# Patient Record
Sex: Female | Born: 1990 | Race: White | Hispanic: No | Marital: Married | Smoking: Current every day smoker
Health system: Southern US, Community
[De-identification: ages and names within clinical notes are randomized; demographics above are authoritative.]

## PROBLEM LIST (undated history)

## (undated) DIAGNOSIS — E7800 Pure hypercholesterolemia, unspecified: Secondary | ICD-10-CM

## (undated) DIAGNOSIS — N289 Disorder of kidney and ureter, unspecified: Secondary | ICD-10-CM

## (undated) HISTORY — PX: WISDOM TOOTH EXTRACTION: SHX21

## (undated) HISTORY — PX: CHOLECYSTECTOMY: SHX55

---

## 2013-05-06 ENCOUNTER — Emergency Department (HOSPITAL_COMMUNITY): Payer: Medicaid Other

## 2013-05-06 ENCOUNTER — Emergency Department (HOSPITAL_COMMUNITY)
Admission: EM | Admit: 2013-05-06 | Discharge: 2013-05-07 | Disposition: A | Discharge status: Home / Self Care | Payer: Medicaid Other | Attending: Emergency Medicine | Admitting: Emergency Medicine

## 2013-05-06 ENCOUNTER — Encounter (HOSPITAL_COMMUNITY): Payer: Self-pay | Admitting: *Deleted

## 2013-05-06 DIAGNOSIS — R110 Nausea: Secondary | ICD-10-CM | POA: Insufficient documentation

## 2013-05-06 DIAGNOSIS — R102 Pelvic and perineal pain: Secondary | ICD-10-CM

## 2013-05-06 DIAGNOSIS — F17200 Nicotine dependence, unspecified, uncomplicated: Secondary | ICD-10-CM | POA: Insufficient documentation

## 2013-05-06 DIAGNOSIS — R109 Unspecified abdominal pain: Secondary | ICD-10-CM

## 2013-05-06 DIAGNOSIS — N949 Unspecified condition associated with female genital organs and menstrual cycle: Secondary | ICD-10-CM | POA: Insufficient documentation

## 2013-05-06 DIAGNOSIS — Z79899 Other long term (current) drug therapy: Secondary | ICD-10-CM | POA: Insufficient documentation

## 2013-05-06 DIAGNOSIS — Z3202 Encounter for pregnancy test, result negative: Secondary | ICD-10-CM | POA: Insufficient documentation

## 2013-05-06 LAB — COMPREHENSIVE METABOLIC PANEL
AST: 23 U/L (ref 0–37)
CO2: 28 mEq/L (ref 19–32)
Calcium: 9.9 mg/dL (ref 8.4–10.5)
Creatinine, Ser: 0.74 mg/dL (ref 0.50–1.10)
GFR calc Af Amer: >90 mL/min (ref >90)
GFR calc non Af Amer: >90 mL/min (ref >90)
Total Protein: 7.9 g/dL (ref 6.0–8.3)

## 2013-05-06 LAB — URINALYSIS, ROUTINE W REFLEX MICROSCOPIC
Nitrite: POSITIVE — AB
Specific Gravity, Urine: 1.015 (ref 1.005–1.030)
Urobilinogen, UA: 1 mg/dL (ref 0.0–1.0)
pH: 6 (ref 5.0–8.0)

## 2013-05-06 LAB — URINE MICROSCOPIC-ADD ON

## 2013-05-06 LAB — CBC WITH DIFFERENTIAL/PLATELET
Basophils Absolute: 0 10*3/uL (ref 0.0–0.1)
Eosinophils Absolute: 0.3 10*3/uL (ref 0.0–0.7)
Eosinophils Relative: 3 % (ref 0–5)
HCT: 43.5 % (ref 36.0–46.0)
Lymphocytes Relative: 27 % (ref 12–46)
MCH: 30.3 pg (ref 26.0–34.0)
MCHC: 34.7 g/dL (ref 30.0–36.0)
MCV: 87.3 fL (ref 78.0–100.0)
Monocytes Absolute: 0.6 10*3/uL (ref 0.1–1.0)
Platelets: 309 10*3/uL (ref 150–400)
RDW: 13.2 % (ref 11.5–15.5)
WBC: 11.4 10*3/uL — ABNORMAL HIGH (ref 4.0–10.5)

## 2013-05-06 LAB — POCT PREGNANCY, URINE: Preg Test, Ur: NEGATIVE

## 2013-05-06 LAB — POCT I-STAT TROPONIN I: Troponin i, poc: 0.01 ng/mL (ref 0.00–0.08)

## 2013-05-06 MED ORDER — IOHEXOL 300 MG/ML  SOLN
100.0000 mL | Freq: Once | INTRAMUSCULAR | Status: AC | PRN
  Administered 2013-05-06: 100 mL via INTRAVENOUS

## 2013-05-06 MED ORDER — HYDROCODONE-ACETAMINOPHEN 5-325 MG PO TABS: 1.0000 | ORAL_TABLET | ORAL | Status: DC | PRN

## 2013-05-06 MED ORDER — ONDANSETRON 4 MG PO TBDP: 8.0000 mg | ORAL_TABLET | Freq: Once | ORAL | Status: DC

## 2013-05-06 MED ORDER — HYDROMORPHONE HCL PF 1 MG/ML IJ SOLN
1.0000 mg | Freq: Once | INTRAMUSCULAR | Status: AC
  Administered 2013-05-06: 1 mg via INTRAVENOUS

## 2013-05-06 MED ORDER — ONDANSETRON HCL 4 MG/2ML IJ SOLN
4.0000 mg | Freq: Once | INTRAMUSCULAR | Status: AC
  Administered 2013-05-06: 4 mg via INTRAVENOUS
  Filled 2013-05-06: qty 2

## 2013-05-06 MED ORDER — MORPHINE SULFATE 4 MG/ML IJ SOLN
4.0000 mg | Freq: Once | INTRAMUSCULAR | Status: AC
  Administered 2013-05-06: 4 mg via INTRAVENOUS
  Filled 2013-05-06: qty 1

## 2013-05-06 MED ORDER — HYDROMORPHONE HCL PF 1 MG/ML IJ SOLN
1.0000 mg | INTRAMUSCULAR | Status: DC | PRN
  Filled 2013-05-06: qty 1

## 2013-05-06 MED ORDER — ONDANSETRON 4 MG PO TBDP: ORAL_TABLET | ORAL | Status: DC

## 2013-05-06 MED ORDER — IOHEXOL 300 MG/ML  SOLN
20.0000 mL | INTRAMUSCULAR | Status: AC
  Administered 2013-05-06: 50 mL via ORAL

## 2013-05-06 MED ORDER — NAPROXEN 500 MG PO TABS: 500.0000 mg | ORAL_TABLET | Freq: Two times a day (BID) | ORAL | Status: DC

## 2013-05-06 NOTE — ED Notes (Signed)
Pt c/o pain.  No meds ordered

## 2013-05-06 NOTE — ED Notes (Signed)
Iv placed.  Xray tech brought oral  contrast

## 2013-05-06 NOTE — ED Provider Notes (Signed)
History     CSN: [id number]  Arrival date & time 05/06/13  1906   First MD Initiated Contact with Patient 05/06/13 2023      Chief Complaint  Patient presents with  . Abdominal Pain    (Consider location/radiation/quality/duration/timing/severity/associated sxs/prior treatment) Patient is a 22 y.o. female presenting with abdominal pain. The history is provided by the patient.  Abdominal Pain This is a new problem. The current episode started in the past 7 days. The problem occurs constantly. The problem has been gradually worsening. Associated symptoms include abdominal pain, anorexia and nausea. Pertinent negatives include no chest pain, chills, congestion, fever, numbness, vomiting or weakness. Exacerbated by: movement. She has tried nothing for the symptoms.    History reviewed. No pertinent past medical history.  Past Surgical History  Procedure Laterality Date  . Cholecystectomy    . Wisdom tooth extraction      No family history on file.  History  Substance Use Topics  . Smoking status: Current Every Day Smoker  . Smokeless tobacco: Not on file  . Alcohol Use: Yes    OB History   Grav Para Term Preterm Abortions TAB SAB Ect Mult Living                  Review of Systems  Constitutional: Negative for fever and chills.  HENT: Negative for congestion and rhinorrhea.   Respiratory: Negative for chest tightness and shortness of breath.   Cardiovascular: Negative for chest pain.  Gastrointestinal: Positive for nausea, abdominal pain and anorexia. Negative for vomiting, diarrhea, constipation and rectal pain.  Genitourinary: Negative for dysuria.  Neurological: Negative for dizziness, weakness and numbness.  All other systems reviewed and are negative.    Allergies  Sulfa antibiotics  Home Medications   Current Outpatient Rx  Name  Route  Sig  Dispense  Refill  . metFORMIN (GLUCOPHAGE) 500 MG tablet   Oral   Take 500 mg by mouth daily with breakfast.            BP 125/79  Pulse 90  Temp(Src) 97.6 F (36.4 C) (Oral)  Resp 18  SpO2 96%  LMP 01/21/2013  Physical Exam  Nursing note and vitals reviewed. Constitutional: She is oriented to person, place, and time. She appears well-developed and well-nourished. No distress.  HENT:  Head: Normocephalic and atraumatic.  Mouth/Throat: Oropharynx is clear and moist.  Eyes: EOM are normal. Pupils are equal, round, and reactive to light.  Neck: Normal range of motion. Neck supple.  Cardiovascular: Normal rate, regular rhythm and normal heart sounds.  Exam reveals no friction rub.   No murmur heard. Pulmonary/Chest: Effort normal and breath sounds normal. No respiratory distress. She has no wheezes. She has no rales.  Abdominal: Soft. There is tenderness (moderate TTP RLQ). There is no rebound and no guarding.  Genitourinary: Vagina normal. No vaginal discharge found.  No CMT. Mild left adnexal discomfort. Moderate right adnexal TTP. Do not appreciate fullness or mass.   Musculoskeletal: Normal range of motion. She exhibits no edema and no tenderness.  Lymphadenopathy:    She has no cervical adenopathy.  Neurological: She is alert and oriented to person, place, and time.  Skin: Skin is warm and dry. No rash noted.  Psychiatric: She has a normal mood and affect. Her behavior is normal.    ED Course  Procedures (including critical care time)  Labs Reviewed  CBC WITH DIFFERENTIAL - Abnormal; Notable for the following:    WBC 11.4 (*)  Hemoglobin 15.1 (*)    All other components within normal limits  COMPREHENSIVE METABOLIC PANEL - Abnormal; Notable for the following:    Glucose, Bld 123 (*)    All other components within normal limits  URINALYSIS, ROUTINE W REFLEX MICROSCOPIC - Abnormal; Notable for the following:    APPearance TURBID (*)    Hgb urine dipstick SMALL (*)    Nitrite POSITIVE (*)    Leukocytes, UA TRACE (*)    All other components within normal limits  URINE  MICROSCOPIC-ADD ON - Abnormal; Notable for the following:    Squamous Epithelial / LPF FEW (*)    Bacteria, UA MANY (*)    All other components within normal limits  URINE CULTURE  POCT PREGNANCY, URINE  POCT I-STAT TROPONIN I   Ct Abdomen Pelvis W Contrast  05/06/2013   *RADIOLOGY REPORT*  Clinical Data: Umbilical pain  CT ABDOMEN AND PELVIS WITH CONTRAST  Technique:  Multidetector CT imaging of the abdomen and pelvis was performed following the standard protocol during bolus administration of intravenous contrast.  Contrast: OMNIPAQUE IOHEXOL 300 MG/ML  SOLN  Comparison: None.  Findings: Linear dependent atelectasis or interstitial infiltrate posteriorly in the visualized lung bases.  Vascular clips in the gallbladder fossa.  Unremarkable liver, spleen, adrenal glands, kidneys, pancreas, abdominal aorta, portal vein.  Stomach, small bowel, and colon are nondilated.  Normal appendix.  Uterus and adnexal regions unremarkable.  Urinary bladder incompletely distended.  No ascites.  No free air.  No adenopathy localized. Lumbar spine intact.  IMPRESSION:  Negative.  Normal appendix.   Original Report Authenticated By: D. Andria Rhein, MD     1. Abdominal pain   2. Adnexal pain       MDM  10:30 PM 21yo female here with 5 days of progressively worsening RLQ AP. Pain started periumbilical and migrated to RLQ a few days ago. Nausea, anorexia but no fever or vomiting. localized TTP over RLQ. Mild leukocytosis noted. Will obtain CT abd/pelvis.   11:30PM: CT negative for appendicitis. No nephrolithiasis noted. Pain improved. Patient ambulatory without difficulty. Tolerating by mouth well. Pelvic exam reveals moderate right adnexal tenderness. Given constant for a week do not feel this is ovarian torsion. Doubt TOA or PID. Discuss with patient. Given referral for women's health to followup and consider outpatient ultrasound. Do not feel she needs emergent ultrasound. She is understanding of plan.  Will discharge with a few Norco and Zofran. She's understanding of plan, given return precautions and discharged him stable condition.  Caren Hazy, MD 05/07/13 [phone]

## 2013-05-06 NOTE — ED Notes (Signed)
Stomach hurting since last Sunday.  Nauseas, dizziness, h/a; feels like she wants to fall.  Ambulatory in ED

## 2013-05-06 NOTE — ED Notes (Signed)
The pt is c/o rlq pain since Sunday with nausea.  lmp march.  She thinks she mat be pregnant but shes not sure alert oriented skin warm and dry

## 2013-05-06 NOTE — ED Provider Notes (Signed)
I saw and evaluated the patient, reviewed the resident's note and I agree with the findings and plan.  Several days right lower quadrant abdominal pain with mild to moderate localized tenderness without rebound.  Hurman Horn, MD 05/07/13 1254

## 2013-05-06 NOTE — ED Notes (Signed)
c-t notified that oral contrast was consumed

## 2013-05-09 LAB — URINE CULTURE

## 2013-05-10 NOTE — Progress Notes (Signed)
ED Antimicrobial Stewardship Positive Culture Follow Up   Wendy Mitchell is an 22 y.o. female who presented to [facility] on 05/06/2013 with a chief complaint of RLQ pain  Chief Complaint  Patient presents with  . Abdominal Pain    Recent Results (from the past 720 hour(s))  URINE CULTURE     Status: None   Collection Time    05/06/13  7:23 PM      Result Value Range Status   Specimen Description URINE, CLEAN CATCH   Final   Special Requests NONE   Final   Culture  Setup Time 05/07/2013 02:40   Final   Colony Count >=100,000 COLONIES/ML   Final   Culture ESCHERICHIA COLI   Final   Report Status 05/09/2013 FINAL   Final   Organism ID, Bacteria ESCHERICHIA COLI   Final    []  Treated with , organism resistant to prescribed antimicrobial [x]  Patient discharged originally without antimicrobial agent and treatment is now indicated  New antibiotic prescription: Cipro 500 mg twice daily for 7 days  ED Provider: Antony Madura, PA-C  Rolley Sims 05/10/2013, 10:08 AM Infectious Diseases Pharmacist Phone# [phone]

## 2013-05-10 NOTE — ED Notes (Signed)
Post ED Visit - Positive Culture Follow-up: Successful Patient Follow-Up  Culture assessed and recommendations reviewed by: []  Wes Dulaney, Pharm.D., BCPS []  Celedonio Miyamoto, Pharm.D., BCPS [x]  Georgina Pillion, Pharm.D., BCPS []  [place], [place].D., BCPS, AAHIVP []  Estella Husk, Pharm.D., BCPS, AAHIVP  Positive urine culture  [x]  Patient discharged without antimicrobial prescription and treatment is now indicated []  Organism is resistant to prescribed ED discharge antimicrobial []  Patient with positive blood cultures  Changes discussed with ED provider:Kelly Hemes New antibiotic prescription  Cipro-500 mg twice daily x 7 days Called to [id number] by Patty PFM Contacted patient : Patient informed of results 05/10/2013 @ 12:36    Larena Sox 05/10/2013, 12:43 PM

## 2013-05-19 ENCOUNTER — Encounter: Payer: Self-pay | Admitting: Advanced Practice Midwife

## 2013-08-07 ENCOUNTER — Encounter (HOSPITAL_COMMUNITY): Payer: Self-pay | Admitting: *Deleted

## 2013-08-07 ENCOUNTER — Emergency Department (HOSPITAL_COMMUNITY)
Admission: EM | Admit: 2013-08-07 | Discharge: 2013-08-07 | Disposition: A | Discharge status: Home / Self Care | Payer: Medicaid Other | Attending: Emergency Medicine | Admitting: Emergency Medicine

## 2013-08-07 DIAGNOSIS — Z9089 Acquired absence of other organs: Secondary | ICD-10-CM | POA: Insufficient documentation

## 2013-08-07 DIAGNOSIS — F17200 Nicotine dependence, unspecified, uncomplicated: Secondary | ICD-10-CM | POA: Insufficient documentation

## 2013-08-07 DIAGNOSIS — B9689 Other specified bacterial agents as the cause of diseases classified elsewhere: Secondary | ICD-10-CM

## 2013-08-07 DIAGNOSIS — N390 Urinary tract infection, site not specified: Secondary | ICD-10-CM

## 2013-08-07 DIAGNOSIS — Z87442 Personal history of urinary calculi: Secondary | ICD-10-CM | POA: Insufficient documentation

## 2013-08-07 DIAGNOSIS — Z3202 Encounter for pregnancy test, result negative: Secondary | ICD-10-CM | POA: Insufficient documentation

## 2013-08-07 DIAGNOSIS — N739 Female pelvic inflammatory disease, unspecified: Secondary | ICD-10-CM | POA: Insufficient documentation

## 2013-08-07 DIAGNOSIS — R42 Dizziness and giddiness: Secondary | ICD-10-CM | POA: Insufficient documentation

## 2013-08-07 DIAGNOSIS — R350 Frequency of micturition: Secondary | ICD-10-CM | POA: Insufficient documentation

## 2013-08-07 DIAGNOSIS — N898 Other specified noninflammatory disorders of vagina: Secondary | ICD-10-CM | POA: Insufficient documentation

## 2013-08-07 DIAGNOSIS — Z8619 Personal history of other infectious and parasitic diseases: Secondary | ICD-10-CM | POA: Insufficient documentation

## 2013-08-07 DIAGNOSIS — R110 Nausea: Secondary | ICD-10-CM | POA: Insufficient documentation

## 2013-08-07 HISTORY — DX: Disorder of kidney and ureter, unspecified: N28.9

## 2013-08-07 HISTORY — DX: Pure hypercholesterolemia, unspecified: E78.00

## 2013-08-07 LAB — COMPREHENSIVE METABOLIC PANEL
Albumin: 4.1 g/dL (ref 3.5–5.2)
Alkaline Phosphatase: 99 U/L (ref 39–117)
BUN: 11 mg/dL (ref 6–23)
Potassium: 4.2 mEq/L (ref 3.5–5.1)
Sodium: 141 mEq/L (ref 135–145)
Total Protein: 7.8 g/dL (ref 6.0–8.3)

## 2013-08-07 LAB — CBC WITH DIFFERENTIAL/PLATELET
Basophils Absolute: 0 10*3/uL (ref 0.0–0.1)
Basophils Relative: 0 % (ref 0–1)
Eosinophils Absolute: 0.4 10*3/uL (ref 0.0–0.7)
MCH: 30.3 pg (ref 26.0–34.0)
MCHC: 34.2 g/dL (ref 30.0–36.0)
Monocytes Relative: 6 % (ref 3–12)
Neutrophils Relative %: 61 % (ref 43–77)
Platelets: 310 10*3/uL (ref 150–400)
RDW: 12.7 % (ref 11.5–15.5)

## 2013-08-07 LAB — URINE MICROSCOPIC-ADD ON

## 2013-08-07 LAB — LIPASE, BLOOD: Lipase: 21 U/L (ref 11–59)

## 2013-08-07 LAB — URINALYSIS, ROUTINE W REFLEX MICROSCOPIC
Bilirubin Urine: NEGATIVE
Nitrite: POSITIVE — AB
Specific Gravity, Urine: 1.027 (ref 1.005–1.030)
pH: 6 (ref 5.0–8.0)

## 2013-08-07 LAB — POCT PREGNANCY, URINE: Preg Test, Ur: NEGATIVE

## 2013-08-07 LAB — WET PREP, GENITAL
Trich, Wet Prep: NONE SEEN
Yeast Wet Prep HPF POC: NONE SEEN

## 2013-08-07 MED ORDER — SODIUM CHLORIDE 0.9 % IV BOLUS (SEPSIS)
1000.0000 mL | Freq: Once | INTRAVENOUS | Status: AC
  Administered 2013-08-07: 1000 mL via INTRAVENOUS

## 2013-08-07 MED ORDER — METRONIDAZOLE 500 MG PO TABS: 500.0000 mg | ORAL_TABLET | Freq: Two times a day (BID) | ORAL | Status: AC

## 2013-08-07 MED ORDER — ONDANSETRON HCL 4 MG/2ML IJ SOLN
4.0000 mg | Freq: Once | INTRAMUSCULAR | Status: AC
  Administered 2013-08-07: 4 mg via INTRAVENOUS
  Filled 2013-08-07: qty 2

## 2013-08-07 MED ORDER — CEFTRIAXONE SODIUM 1 G IJ SOLR
1.0000 g | Freq: Once | INTRAMUSCULAR | Status: AC
  Administered 2013-08-07: 1 g via INTRAVENOUS
  Filled 2013-08-07: qty 10

## 2013-08-07 MED ORDER — HYDROMORPHONE HCL PF 1 MG/ML IJ SOLN
1.0000 mg | Freq: Once | INTRAMUSCULAR | Status: AC
  Administered 2013-08-07: 1 mg via INTRAVENOUS
  Filled 2013-08-07: qty 1

## 2013-08-07 MED ORDER — CEPHALEXIN 500 MG PO CAPS: 500.0000 mg | ORAL_CAPSULE | Freq: Four times a day (QID) | ORAL | Status: AC

## 2013-08-07 MED ORDER — DOXYCYCLINE HYCLATE 100 MG PO CAPS: 100.0000 mg | ORAL_CAPSULE | Freq: Two times a day (BID) | ORAL | Status: AC

## 2013-08-07 NOTE — ED Notes (Signed)
2nd meal tray given to pt with drink.

## 2013-08-07 NOTE — ED Provider Notes (Signed)
CSN: [id number]     Arrival date & time 08/07/13  1359 History   First MD Initiated Contact with Patient 08/07/13 1839     Chief Complaint  Patient presents with  . Abdominal Pain  . Dizziness   (Consider location/radiation/quality/duration/timing/severity/associated sxs/prior Treatment) The history is provided by the patient.   22 year old female with past surgical history of cholecystectomy here for abdominal pain and nausea. Patient reports the symptoms have been present for several days. The abdominal pain is located in her lower abdomen, rated at 7/10. It is described as aching in nature and is nonradiating.  She endorses some associated nausea, but has not had any vomiting.  She denies fevers, chills, cough, dysuria, diarrhea, constipation, anorexia, vaginal.  She is sexually active with one partner. She occasionally uses protection. She expressed some concern over STD exposure she has a history of gonorrhea and Chlamydia. She reports being tested for HIV in the past and that it was negative.  Past Medical History  Diagnosis Date  . Hypercholesteremia   . Renal disorder     kidney stones   Past Surgical History  Procedure Laterality Date  . Cholecystectomy    . Wisdom tooth extraction     No family history on file. History  Substance Use Topics  . Smoking status: Current Every Day Smoker -- 0.50 packs/day    Types: Cigarettes  . Smokeless tobacco: Not on file  . Alcohol Use: Yes   OB History   Grav Para Term Preterm Abortions TAB SAB Ect Mult Living                 Review of Systems  Constitutional: Negative for fever and chills.  Respiratory: Negative for cough and shortness of breath.   Cardiovascular: Negative for chest pain.  Gastrointestinal: Negative for nausea, vomiting, abdominal pain and diarrhea.  Genitourinary: Positive for frequency and vaginal discharge. Negative for dysuria, vaginal bleeding, difficulty urinating and dyspareunia.  Musculoskeletal:  Negative for back pain, joint swelling and gait problem.  Skin: Negative for rash.  Neurological: Negative for syncope and headaches.  All other systems reviewed and are negative.    Allergies  Morphine and related and Sulfa antibiotics  Home Medications   Current Outpatient Rx  Name  Route  Sig  Dispense  Refill  . fish oil-omega-3 fatty acids 1000 MG capsule   Oral   Take 2 g by mouth daily.          BP 99/76  Pulse 97  Temp(Src) 97.2 F (36.2 C) (Oral)  Resp 16  Ht 5\' 8"  (1.727 m)  Wt 270 lb (122.471 kg)  BMI 41.06 kg/m2  SpO2 98%  LMP 07/19/2013 Physical Exam  Vitals reviewed. Constitutional: She is oriented to person, place, and time. She appears well-developed and well-nourished. No distress.  HENT:  Right Ear: External ear normal.  Left Ear: External ear normal.  Mouth/Throat: No oropharyngeal exudate.  Eyes: Conjunctivae and EOM are normal. Pupils are equal, round, and reactive to light.  Neck: Normal range of motion. Neck supple.  Cardiovascular: Normal rate, regular rhythm, normal heart sounds and intact distal pulses.  Exam reveals no gallop and no friction rub.   No murmur heard. Pulmonary/Chest: Effort normal and breath sounds normal.  Abdominal: Soft. Bowel sounds are normal. She exhibits no distension. There is tenderness (mild, suprapubic and lower abdomen). There is no rebound, no guarding and no CVA tenderness.  Genitourinary: There is no rash, tenderness or lesion on the right labia. There  is no rash, tenderness or lesion on the left labia. Uterus is not tender. Cervix exhibits motion tenderness and discharge. Cervix exhibits no friability. Right adnexum displays tenderness. Right adnexum displays no mass and no fullness. Left adnexum displays tenderness. Left adnexum displays no mass and no fullness. Vaginal discharge (White) found.  Musculoskeletal: Normal range of motion. She exhibits no edema.  Lymphadenopathy:       Right: No inguinal adenopathy  present.       Left: No inguinal adenopathy present.  Neurological: She is alert and oriented to person, place, and time. No cranial nerve deficit.  Skin: Skin is warm and dry. No rash noted.  Psychiatric: She has a normal mood and affect.     ED Course  Procedures (including critical care time) Labs Review Labs Reviewed  WET PREP, GENITAL - Abnormal; Notable for the following:    Clue Cells Wet Prep HPF POC FEW (*)    WBC, Wet Prep HPF POC FEW (*)    All other components within normal limits  URINALYSIS, ROUTINE W REFLEX MICROSCOPIC - Abnormal; Notable for the following:    APPearance CLOUDY (*)    Hgb urine dipstick MODERATE (*)    Ketones, ur 15 (*)    Nitrite POSITIVE (*)    Leukocytes, UA SMALL (*)    All other components within normal limits  URINE MICROSCOPIC-ADD ON - Abnormal; Notable for the following:    Squamous Epithelial / LPF FEW (*)    Bacteria, UA MANY (*)    All other components within normal limits  URINE CULTURE  GC/CHLAMYDIA PROBE AMP  CBC WITH DIFFERENTIAL  COMPREHENSIVE METABOLIC PANEL  LIPASE, BLOOD  POCT PREGNANCY, URINE   Imaging Review No results found.  MDM   22 year old with past surgical history of cholecystectomy here with abdominal pain for nausea. Exam is as documented above. Mild suprapubic and lower abdominal tenderness. No peritoneal signs No CVA tenderness. Pelvic exam with discharge, CMT and mild bilateral adnexal tenderness. No masses appreciated. Pain did not lateralize. Doubt TOA and torsion.  UA consistent with UTI.  Do not feel that imaging is indicated given her reassuring abdominal exam. Will treat for UTI, PID and BV. 1g Rocephin given in the emergency department.  PID education given. Safe sex practices encouraged. Strong abdominal pain return precautions were reviewed. It is felt the patient is stable for discharge   Clinical Impression: 1. UTI (urinary tract infection)   2. PID (pelvic inflammatory disease)   3. BV  (bacterial vaginosis)     Disposition: Discharge  Condition: Good  I have discussed the results, Dx and Tx plan. They expressed understanding and agree with the plan and were told to return to ED with any worsening of condition or concern.    Discharge Medication List as of 08/07/2013 10:15 PM    START taking these medications   Details  cephALEXin (KEFLEX) 500 MG capsule Take 1 capsule (500 mg total) by mouth 4 (four) times daily., Starting 08/07/2013, Until Discontinued, Print    doxycycline (VIBRAMYCIN) 100 MG capsule Take 1 capsule (100 mg total) by mouth 2 (two) times daily., Starting 08/07/2013, Until Discontinued, Print    metroNIDAZOLE (FLAGYL) 500 MG tablet Take 1 tablet (500 mg total) by mouth 2 (two) times daily. One po bid x 7 days, Starting 08/07/2013, Until Discontinued, Print        Follow Up: [facility]     [address] [place] [address]  Pt seen in conjunction with Dr. Elesa Massed.  Reine Just. Beverely Pace, MD Emergency Medicine PGY-III [phone]    Oleh Genin, MD 08/08/13 [phone]

## 2013-08-07 NOTE — ED Provider Notes (Signed)
I saw and evaluated the patient, reviewed the resident's note and I agree with the findings and plan.   Patient is a 22 year old female with a history of diabetes, hyperlipidemia who presents to the emergency department with diffuse lower abdominal pain for the past week as well as nausea. On exam, patient is hemodynamically stable and diffusely tender across her lower abdomen with no voluntary guarding or rebound. Patient does have urinary tract infection. We'll send urine culture and start antibiotics. Pelvic exam performed patient had cervical motion tenderness. Possible exposure to STI's. Will treat for PID.  Wendy Maw Ward, DO 08/07/13 2216

## 2013-08-07 NOTE — ED Notes (Signed)
Meal tray given per MD

## 2013-08-07 NOTE — ED Notes (Signed)
Pt denies any abdominal pain or questions upon discharge.

## 2013-08-07 NOTE — ED Notes (Signed)
Pt to ED c/o RLQ pain and nausea x 1.5 weeks.  Since Fri pt also feels like the room is spinning, even if she is still.  LMP 8/27, but pt states she only bled, she did not cramp at all.

## 2013-08-07 NOTE — ED Notes (Signed)
Pt resting with family at bedside. Pt reports having lower abdominal pain and nausea. Plan of care is updated with verbal understanding and will continue to monitor pt. MD will be made aware and to follow up.

## 2013-08-08 ENCOUNTER — Emergency Department (HOSPITAL_COMMUNITY)
Admission: EM | Admit: 2013-08-08 | Discharge: 2013-08-08 | Disposition: A | Discharge status: Home / Self Care | Payer: Medicaid Other | Attending: Emergency Medicine | Admitting: Emergency Medicine

## 2013-08-08 ENCOUNTER — Encounter (HOSPITAL_COMMUNITY): Payer: Self-pay | Admitting: Emergency Medicine

## 2013-08-08 DIAGNOSIS — Y9389 Activity, other specified: Secondary | ICD-10-CM | POA: Insufficient documentation

## 2013-08-08 DIAGNOSIS — Z23 Encounter for immunization: Secondary | ICD-10-CM | POA: Insufficient documentation

## 2013-08-08 DIAGNOSIS — F17200 Nicotine dependence, unspecified, uncomplicated: Secondary | ICD-10-CM | POA: Insufficient documentation

## 2013-08-08 DIAGNOSIS — IMO0002 Reserved for concepts with insufficient information to code with codable children: Secondary | ICD-10-CM | POA: Insufficient documentation

## 2013-08-08 DIAGNOSIS — Z882 Allergy status to sulfonamides status: Secondary | ICD-10-CM | POA: Insufficient documentation

## 2013-08-08 DIAGNOSIS — R51 Headache: Secondary | ICD-10-CM | POA: Insufficient documentation

## 2013-08-08 DIAGNOSIS — W260XXA Contact with knife, initial encounter: Secondary | ICD-10-CM | POA: Insufficient documentation

## 2013-08-08 DIAGNOSIS — T7492XA Unspecified child maltreatment, confirmed, initial encounter: Secondary | ICD-10-CM | POA: Insufficient documentation

## 2013-08-08 DIAGNOSIS — S0100XA Unspecified open wound of scalp, initial encounter: Secondary | ICD-10-CM | POA: Insufficient documentation

## 2013-08-08 DIAGNOSIS — Y929 Unspecified place or not applicable: Secondary | ICD-10-CM | POA: Insufficient documentation

## 2013-08-08 DIAGNOSIS — Z885 Allergy status to narcotic agent status: Secondary | ICD-10-CM | POA: Insufficient documentation

## 2013-08-08 DIAGNOSIS — R110 Nausea: Secondary | ICD-10-CM | POA: Insufficient documentation

## 2013-08-08 DIAGNOSIS — T7491XA Unspecified adult maltreatment, confirmed, initial encounter: Secondary | ICD-10-CM | POA: Insufficient documentation

## 2013-08-08 DIAGNOSIS — E7800 Pure hypercholesterolemia, unspecified: Secondary | ICD-10-CM | POA: Insufficient documentation

## 2013-08-08 DIAGNOSIS — Z87442 Personal history of urinary calculi: Secondary | ICD-10-CM | POA: Insufficient documentation

## 2013-08-08 LAB — GC/CHLAMYDIA PROBE AMP: CT Probe RNA: NEGATIVE

## 2013-08-08 MED ORDER — ONDANSETRON 4 MG PO TBDP
4.0000 mg | ORAL_TABLET | Freq: Once | ORAL | Status: AC
  Administered 2013-08-08: 4 mg via ORAL
  Filled 2013-08-08: qty 1

## 2013-08-08 MED ORDER — TETANUS-DIPHTH-ACELL PERTUSSIS 5-2.5-18.5 LF-MCG/0.5 IM SUSP
0.5000 mL | Freq: Once | INTRAMUSCULAR | Status: AC
  Administered 2013-08-08: 0.5 mL via INTRAMUSCULAR
  Filled 2013-08-08: qty 0.5

## 2013-08-08 MED ORDER — ACETAMINOPHEN 325 MG PO TABS
650.0000 mg | ORAL_TABLET | Freq: Once | ORAL | Status: AC
  Administered 2013-08-08: 650 mg via ORAL
  Filled 2013-08-08: qty 2
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
## 2013-08-09 LAB — URINE CULTURE

## 2013-08-10 NOTE — Progress Notes (Signed)
ED Antimicrobial Stewardship Positive Culture Follow Up   Wendy Mitchell is an 22 y.o. female who presented to [facility] on 08/07/2013 with a chief complaint of RLQ pain, nausea  Chief Complaint  Patient presents with  . Abdominal Pain  . Dizziness    Recent Results (from the past 720 hour(s))  URINE CULTURE     Status: None   Collection Time    08/07/13  5:12 PM      Result Value Range Status   Specimen Description URINE, CLEAN CATCH   Final   Special Requests NONE   Final   Culture  Setup Time     Final   Value: 08/07/2013 19:24     Performed at [facility] Count     Final   Value: >=100,000 COLONIES/ML     Performed at [facility]   Culture     Final   Value: ESCHERICHIA COLI     Performed at [facility]   Report Status 08/09/2013 FINAL   Final   Organism ID, Bacteria ESCHERICHIA COLI   Final  GC/CHLAMYDIA PROBE AMP     Status: None   Collection Time    08/07/13 10:00 PM      Result Value Range Status   CT Probe RNA NEGATIVE  NEGATIVE Final   GC Probe RNA NEGATIVE  NEGATIVE Final   Comment: (NOTE)                                                                                              **Normal Reference Range: Negative**          Assay performed using the Gen-Probe APTIMA COMBO2 (R) Assay.     Acceptable specimen types for this assay include APTIMA Swabs (Unisex,     endocervical, urethral, or vaginal), first void urine, and ThinPrep     liquid based cytology samples.     Performed at [facility]  WET PREP, GENITAL     Status: Abnormal   Collection Time    08/07/13 10:00 PM      Result Value Range Status   Yeast Wet Prep HPF POC NONE SEEN  NONE SEEN Final   Trich, Wet Prep NONE SEEN  NONE SEEN Final   Clue Cells Wet Prep HPF POC FEW (*) NONE SEEN Final   WBC, Wet Prep HPF POC FEW (*) NONE SEEN Final    [x]  Treated with Keflex, organism resistant to prescribed antimicrobial  [name] who presened with RLQ pain  and nausea -- work up revealed BV, PID, and an UTI. The patient was sent home on Flagyl and Doxy to treat the BV/PID however the UTI is resistant to the prescribed antibiotic, Keflex  New antibiotic prescription: Macrobid 100 mg bid x 7 days (continue Doxy and Flagyl as prescribed)  ED Provider: Coral Ceo, PA-C  Rolley Sims 08/10/2013, 10:26 AM Infectious Diseases Pharmacist Phone# [phone]

## 2013-08-10 NOTE — ED Notes (Signed)
Post ED Visit - Positive Culture Follow-up: Successful Patient Follow-Up  Culture assessed and recommendations reviewed by: []  Wes Dulaney, Pharm.D., BCPS []  Celedonio Miyamoto, Pharm.D., BCPS [x]  Georgina Pillion, [address].D., BCPS []  Gutierrez, [address].D., BCPS, AAHIVP []  Estella Husk, Pharm.D., BCPS, AAHIVP  Positive Urine culture  []  Patient discharged without antimicrobial prescription and treatment is now indicated [x]  Organism is resistant to prescribed ED discharge antimicrobial []  Patient with positive blood cultures  Changes discussed with ED provider: Coral Ceo New antibiotic prescription D/C Keflex change to Macrobid 100 mg bid x 7 days   Contacted patient: Left voice mail message, date 08/10/2013, time 1355  Larena Sox 08/10/2013, 1:52 PM

## 2013-08-11 ENCOUNTER — Telehealth (HOSPITAL_COMMUNITY): Payer: Self-pay | Admitting: *Deleted

## 2013-08-13 ENCOUNTER — Telehealth (HOSPITAL_COMMUNITY): Payer: Self-pay | Admitting: Emergency Medicine

## 2013-08-13 NOTE — ED Notes (Signed)
Unable to contact patient via phone. Sent letter. °

## 2013-10-06 ENCOUNTER — Telehealth (HOSPITAL_COMMUNITY): Payer: Self-pay | Admitting: Emergency Medicine

## 2013-10-06 NOTE — ED Notes (Signed)
No response to letter sent after 30 days. Chart sent to Medical Records. °

## 2014-05-13 IMAGING — CT CT ABD-PELV W/ CM
1 of 2 series · 16 of 32 positions shown, 20 images · IV contrast (APPLIED)
Comparison: None.

CLINICAL DATA: Umbilical pain

CT ABDOMEN AND PELVIS WITH CONTRAST
TECHNIQUE: Multidetector CT imaging of the abdomen and pelvis was
performed following the standard protocol during bolus
administration of intravenous contrast.
Contrast: 100mL OMNIPAQUE IOHEXOL 300 MG/ML  SOLN

[Series 2: abd/pelv with 5.0 b31f st · axial · 0.85mm/px · z∈[+719,+1234]mm · 16 of 113 slices shown, 20 images]
[im 5/113  soft-tissue]
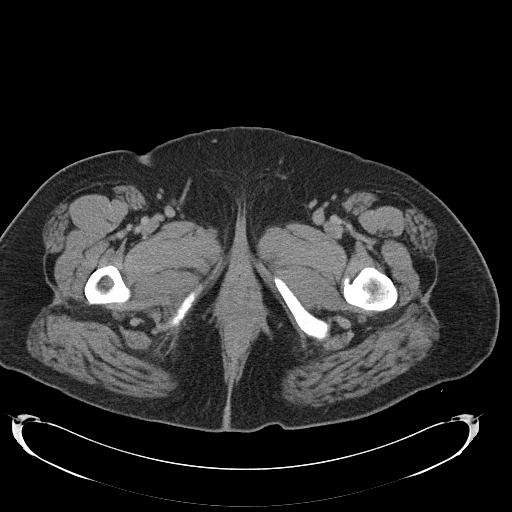
[im 5/113  bone]
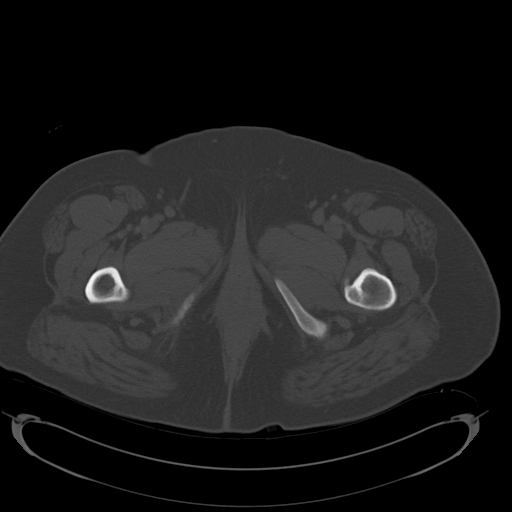
[im 15/113  soft-tissue]
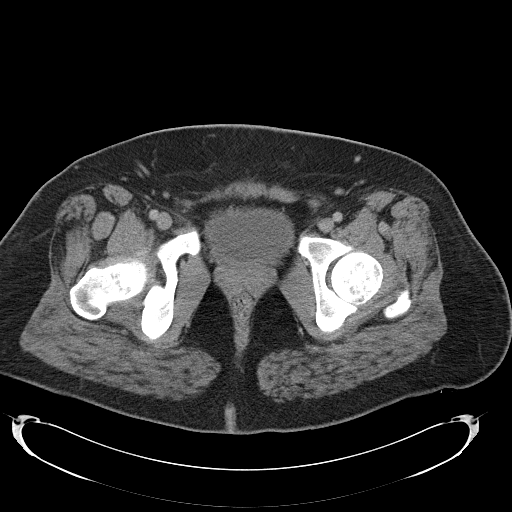
[im 24/113  soft-tissue]
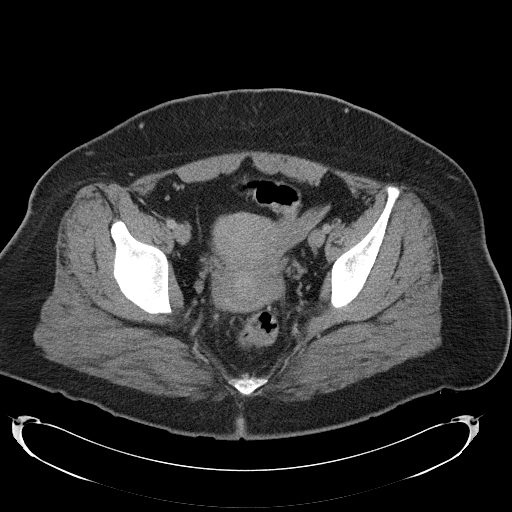
[im 29/113  soft-tissue]
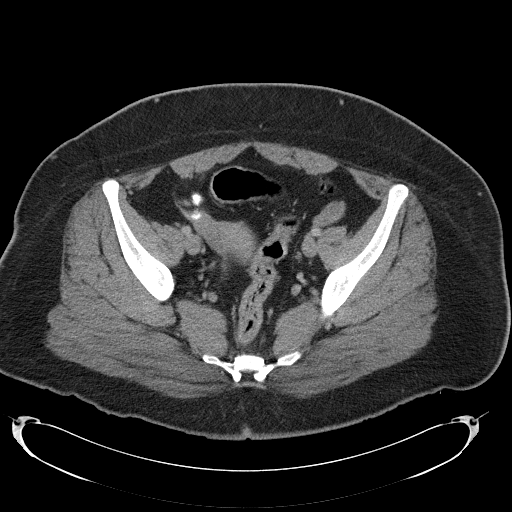
[im 38/113  soft-tissue]
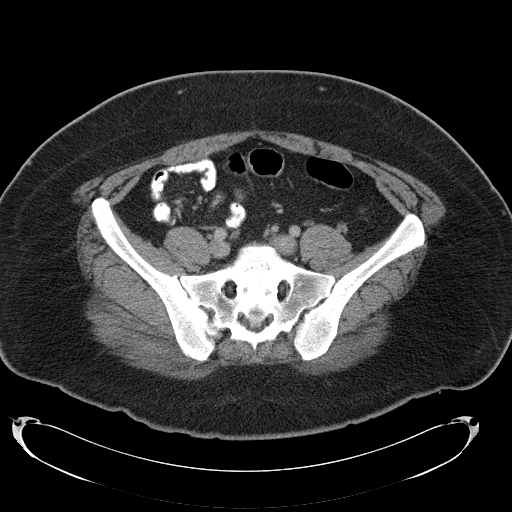
[im 47/113  soft-tissue]
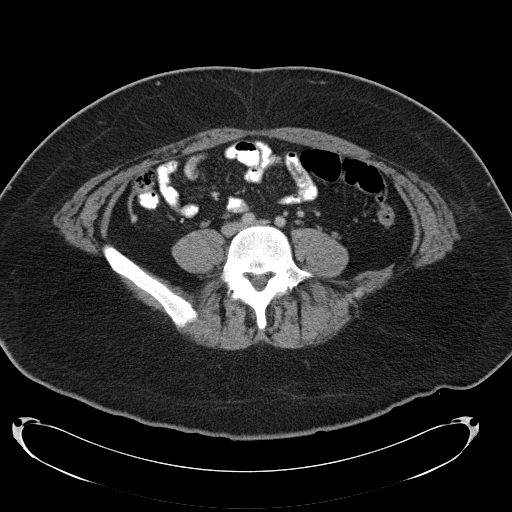
[im 52/113  soft-tissue]
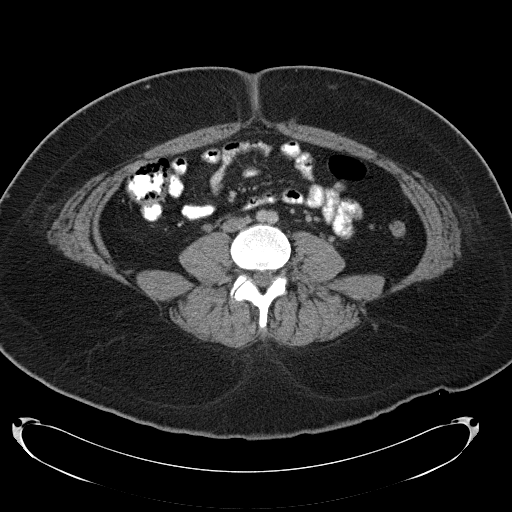
[im 61/113  soft-tissue]
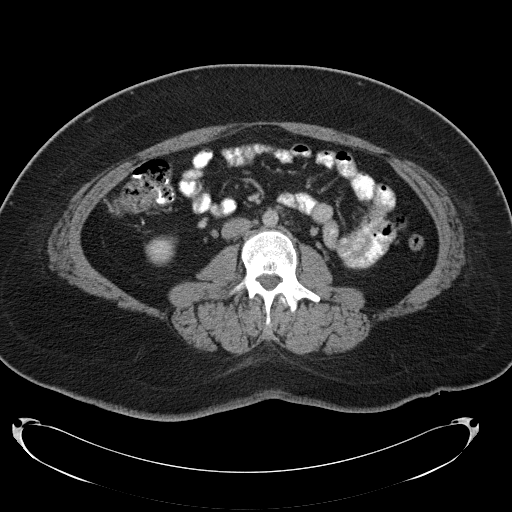
[im 66/113  soft-tissue]
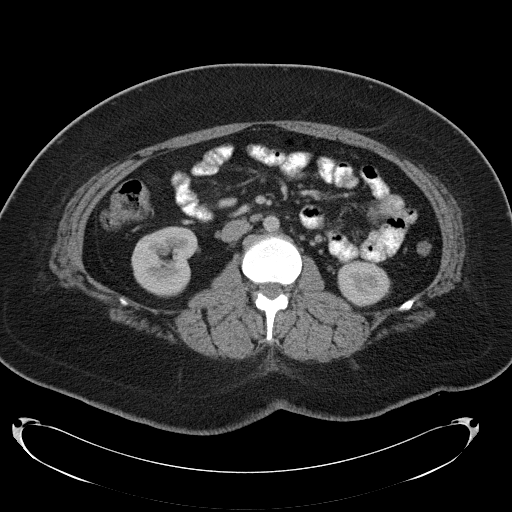
[im 66/113  bone]
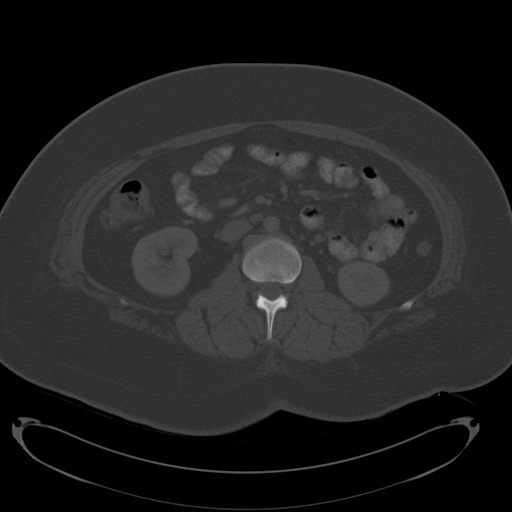
[im 75/113  soft-tissue]
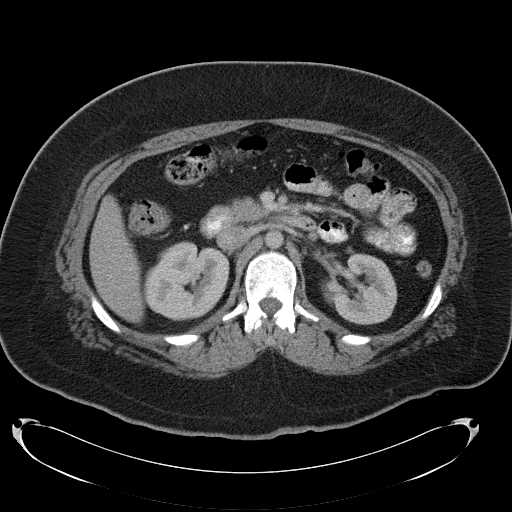
[im 85/113  soft-tissue]
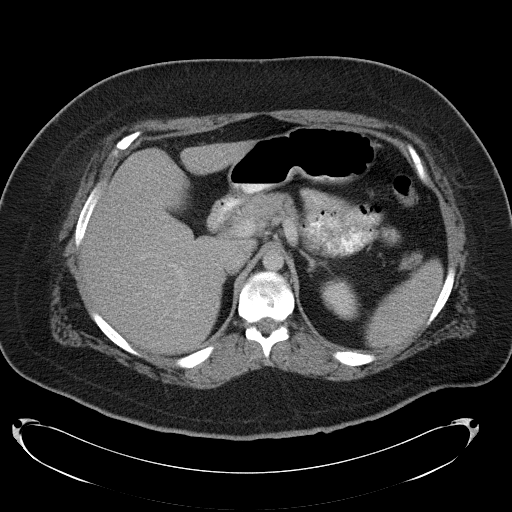
[im 89/113  soft-tissue]
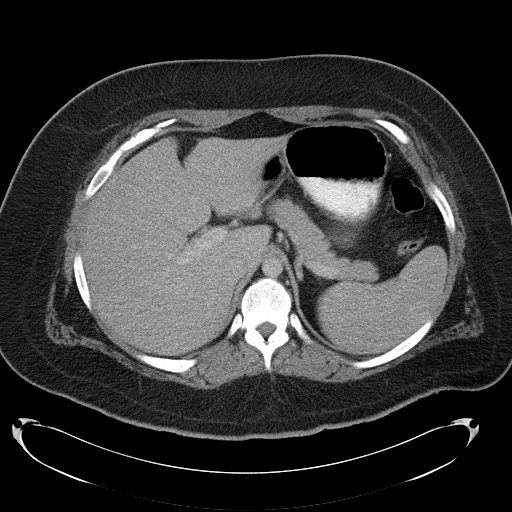
[im 94/113  lung]
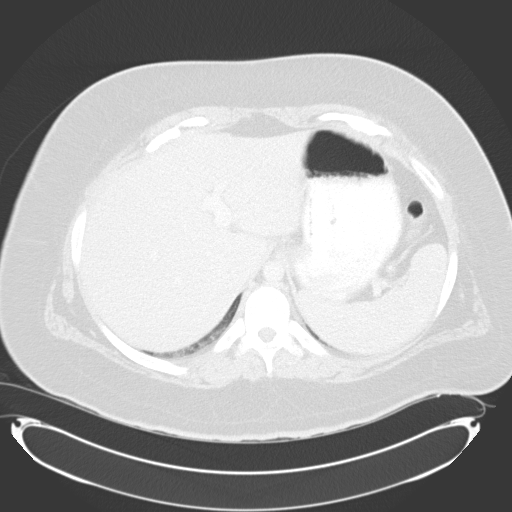
[im 99/113  soft-tissue]
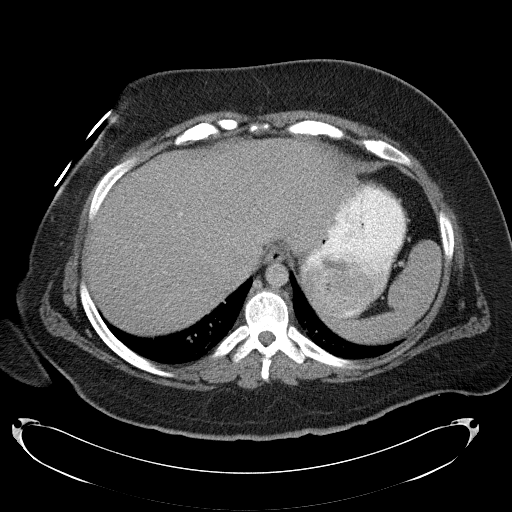
[im 99/113  lung]
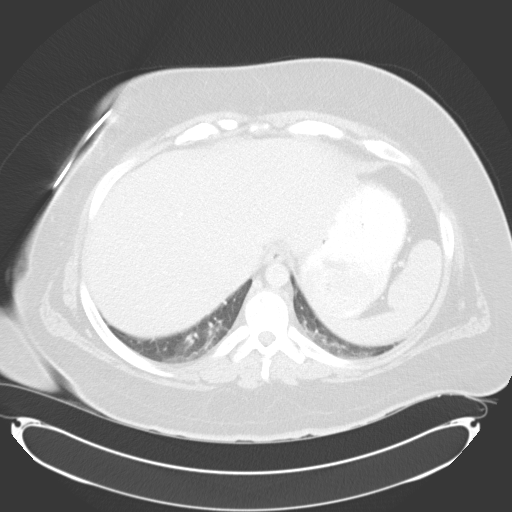
[im 103/113  lung]
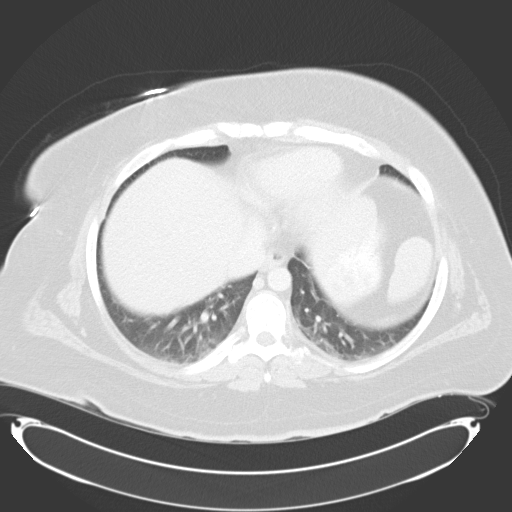
[im 108/113  soft-tissue]
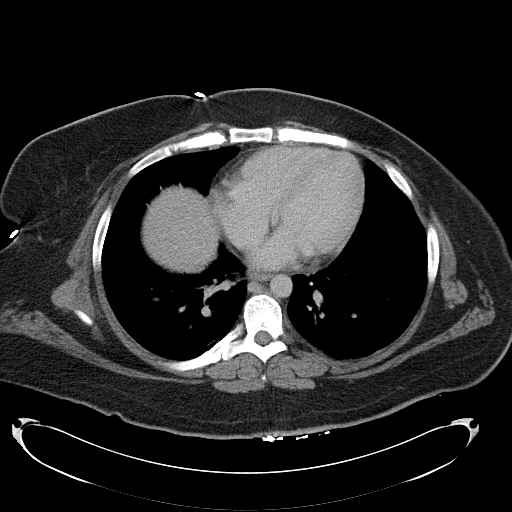
[im 108/113  lung]
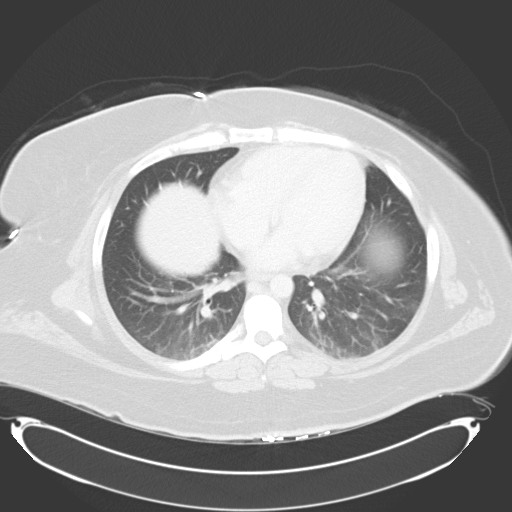

[16 of 32 positions shown; findings below may reference images not displayed]

FINDINGS: Linear dependent atelectasis or interstitial infiltrate
posteriorly in the visualized lung bases.  Vascular clips in the
gallbladder fossa.  Unremarkable liver, spleen, adrenal glands,
kidneys, pancreas, abdominal aorta, portal vein.  Stomach, small
bowel, and colon are nondilated.  Normal appendix.  Uterus and
adnexal regions unremarkable.  Urinary bladder incompletely
distended.  No ascites.  No free air.  No adenopathy localized.
Lumbar spine intact.
IMPRESSION: Negative.  Normal appendix.

## 2023-02-19 ENCOUNTER — Telehealth (HOSPITAL_COMMUNITY): Payer: Self-pay

## 2023-02-19 ENCOUNTER — Ambulatory Visit (INDEPENDENT_AMBULATORY_CARE_PROVIDER_SITE_OTHER): Payer: Medicaid Other | Admitting: Psychiatry

## 2023-02-19 ENCOUNTER — Encounter (HOSPITAL_COMMUNITY): Payer: Self-pay | Admitting: Psychiatry

## 2023-02-19 DIAGNOSIS — F4310 Post-traumatic stress disorder, unspecified: Secondary | ICD-10-CM | POA: Diagnosis not present

## 2023-02-19 DIAGNOSIS — Z8659 Personal history of other mental and behavioral disorders: Secondary | ICD-10-CM | POA: Diagnosis not present

## 2023-02-19 DIAGNOSIS — F3130 Bipolar disorder, current episode depressed, mild or moderate severity, unspecified: Secondary | ICD-10-CM | POA: Diagnosis not present

## 2023-02-19 MED ORDER — DULOXETINE HCL 30 MG PO CPEP: 30.0000 mg | ORAL_CAPSULE | Freq: Every day | ORAL | 0 refills | Status: DC

## 2023-02-19 MED ORDER — GABAPENTIN 300 MG PO CAPS: 300.0000 mg | ORAL_CAPSULE | Freq: Two times a day (BID) | ORAL | 0 refills | Status: AC

## 2023-02-19 MED ORDER — ARIPIPRAZOLE 10 MG PO TABS: 10.0000 mg | ORAL_TABLET | Freq: Every day | ORAL | 0 refills | Status: DC
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
## 2023-02-19 NOTE — Telephone Encounter (Signed)
Medication management - Prior authorization submitted online with CoverMyMeds for patient's Abilify 10 mg, one a day and pending decision.

## 2023-03-25 ENCOUNTER — Ambulatory Visit (HOSPITAL_COMMUNITY): Payer: Medicaid Other | Admitting: Psychiatry

## 2023-03-31 ENCOUNTER — Telehealth (HOSPITAL_COMMUNITY): Payer: Self-pay

## 2023-03-31 DIAGNOSIS — F3130 Bipolar disorder, current episode depressed, mild or moderate severity, unspecified: Secondary | ICD-10-CM

## 2023-03-31 NOTE — Telephone Encounter (Signed)
Medication refill - Fax from patient's [facility] on [address] requesting a new Aripiprazole 10 mg order, last provided for #30 on 02/19/23 when pt last seen. Pt. no showed 03/25/23 and has not rescheduled.

## 2023-03-31 NOTE — Telephone Encounter (Signed)
Medication management - Message left for patient to call our office to schedule her next appointment due to patient only being seen once, for her initial psychiatric assessment and no showed on 03/25/23. No refills at this time per Dr. Gilmore Laroche.

## 2023-04-01 MED ORDER — ARIPIPRAZOLE 10 MG PO TABS
10.0000 mg | ORAL_TABLET | Freq: Every day | ORAL | 0 refills | Status: AC
Start: 2023-04-01 — End: ?

## 2023-04-01 NOTE — Telephone Encounter (Signed)
Medication management - Telephone call with patient to inform Dr. Fredda Hammed authorized a one time refill of her Aripiprazole 10 mg and that this had been e-scribed to her [facility] on [address] in [place] as verbally approved.  Patient shared that her Mother had passed away the previous evening but planned to keep appointment set for 04/13/23 at 3 pm.  Patient stated she would call back to reschedule if the appointment interfered with funeral arrangements once knew..  Patient stated doing okay at present and to call if any issues.

## 2023-04-06 ENCOUNTER — Telehealth (HOSPITAL_COMMUNITY): Payer: Self-pay

## 2023-04-06 NOTE — Telephone Encounter (Signed)
Medication refill - Fax from [facility]'s [facility] on [address] in [place] for a new Duloxetine 30 mg order, #30 for 30 days, last provided when seen 02/19/23. Order last filled 03/01/23, pt no showed 03/25/23 and canceled appt 08-May-2023 due to Mother's recent death.  Patient has not rescheduled appointment yet.

## 2023-04-07 MED ORDER — DULOXETINE HCL 30 MG PO CPEP: 30.0000 mg | ORAL_CAPSULE | Freq: Every day | ORAL | 0 refills | Status: AC

## 2023-04-13 ENCOUNTER — Ambulatory Visit (HOSPITAL_COMMUNITY): Payer: Medicaid Other | Admitting: Psychiatry
